# Patient Record
Sex: Male | Born: 2000 | Race: Black or African American | Hispanic: No | Marital: Single | Smoking: Former smoker
Health system: Southern US, Community
[De-identification: ages and names within clinical notes are randomized; demographics above are authoritative.]

## PROBLEM LIST (undated history)

## (undated) DIAGNOSIS — Z789 Other specified health status: Secondary | ICD-10-CM

## (undated) DIAGNOSIS — S6290XA Unspecified fracture of unspecified wrist and hand, initial encounter for closed fracture: Secondary | ICD-10-CM

## (undated) DIAGNOSIS — M9250 Juvenile osteochondrosis of tibia and fibula, unspecified leg: Secondary | ICD-10-CM

## (undated) HISTORY — DX: Unspecified fracture of unspecified wrist and hand, initial encounter for closed fracture: S62.90XA

## (undated) HISTORY — DX: Other specified health status: Z78.9

## (undated) HISTORY — DX: Juvenile osteochondrosis of tibia and fibula, unspecified leg: M92.50

---
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
## 2020-06-01 ENCOUNTER — Emergency Department (HOSPITAL_BASED_OUTPATIENT_CLINIC_OR_DEPARTMENT_OTHER)
Admission: EM | Admit: 2020-06-01 | Discharge: 2020-06-01 | Disposition: A | Discharge status: Home / Self Care | Payer: Medicaid Other | Attending: Emergency Medicine | Admitting: Emergency Medicine

## 2020-06-01 ENCOUNTER — Other Ambulatory Visit: Payer: Self-pay

## 2020-06-01 ENCOUNTER — Encounter (HOSPITAL_BASED_OUTPATIENT_CLINIC_OR_DEPARTMENT_OTHER): Payer: Self-pay | Admitting: Emergency Medicine

## 2020-06-01 ENCOUNTER — Emergency Department (HOSPITAL_BASED_OUTPATIENT_CLINIC_OR_DEPARTMENT_OTHER): Payer: Medicaid Other

## 2020-06-01 ENCOUNTER — Encounter (INDEPENDENT_AMBULATORY_CARE_PROVIDER_SITE_OTHER): Payer: Self-pay

## 2020-06-01 DIAGNOSIS — Z87891 Personal history of nicotine dependence: Secondary | ICD-10-CM | POA: Insufficient documentation

## 2020-06-01 DIAGNOSIS — B9789 Other viral agents as the cause of diseases classified elsewhere: Secondary | ICD-10-CM | POA: Diagnosis not present

## 2020-06-01 DIAGNOSIS — J069 Acute upper respiratory infection, unspecified: Secondary | ICD-10-CM | POA: Diagnosis not present

## 2020-06-01 DIAGNOSIS — R040 Epistaxis: Secondary | ICD-10-CM | POA: Diagnosis not present

## 2020-06-01 DIAGNOSIS — R1110 Vomiting, unspecified: Secondary | ICD-10-CM | POA: Diagnosis not present

## 2020-06-01 DIAGNOSIS — Z20822 Contact with and (suspected) exposure to covid-19: Secondary | ICD-10-CM | POA: Diagnosis not present

## 2020-06-01 DIAGNOSIS — R059 Cough, unspecified: Secondary | ICD-10-CM | POA: Diagnosis not present

## 2020-06-01 DIAGNOSIS — R0981 Nasal congestion: Secondary | ICD-10-CM | POA: Diagnosis present

## 2020-06-01 LAB — RESP PANEL BY RT-PCR (FLU A&B, COVID) ARPGX2
Influenza A by PCR: POSITIVE — AB
Influenza B by PCR: NEGATIVE
SARS Coronavirus 2 by RT PCR: NEGATIVE

## 2020-06-01 NOTE — ED Provider Notes (Signed)
[facility] EMERGENCY DEPARTMENT Provider Note   CSN: [id number] Arrival date & time: 06/01/20  1915     History Chief Complaint  Patient presents with  . Nasal Congestion    Ricky Peterson is a 20 y.o. male.  HPI Patient reports he started getting sick about 3 days ago.  He has had nasal congestion and coughing.  He reports he is coughed pretty hard and is coughing up some white mucus.  He reports he is also sometimes having bleeding of the nose.  Reports his only bleeding on the right side.  Is bled several times.  He has some upper body aches.  He is experiencing some pain on the left side of the chest with coughing.  Patient reports has had chills and thought he might of had a fever but has not measured it at home.  He reports one episode of vomiting at work yesterday.  No diarrhea.  No abdominal pain.  Patient has not had COVID vaccine.    Past Medical History:  Diagnosis Date  . Fracture of hand   . Medical history non-contributory   . Osgood-Schlatter's disease 10/26/2013    Patient Active Problem List   Diagnosis Date Noted  . Atopic dermatitis 12/05/2014    History reviewed. No pertinent surgical history.     No family history on file.  Social History   Tobacco Use  . Smoking status: Former Games developer  . Smokeless tobacco: Never Used  Vaping Use  . Vaping Use: Some days  Substance Use Topics  . Alcohol use: Never  . Drug use: Never    Home Medications Prior to Admission medications   Not on File    Allergies    Patient has no known allergies.  Review of Systems   Review of Systems 10 systems reviewed and negative except as per HPI Physical Exam Updated Vital Signs BP 104/64   Pulse 88   Temp 99.7 F (37.6 C) (Oral)   Resp 19   Ht 6\' 7"  (2.007 m)   Wt 90.7 kg   SpO2 99%   BMI 22.53 kg/m   Physical Exam Constitutional:      Appearance: Normal appearance. He is well-developed.     Comments: Nontoxic.  No respiratory distress.   Clinically well in appearance.  HENT:     Head: Normocephalic and atraumatic.     Nose:     Comments: The Kiesselbach plexus on the right is slightly irritated and friable.  A few punctate areas with blood on them.  No clot in the naris.  No active bleeding.  Oral cavity is widely patent.  Mucous membranes are pink and moist.  No exudate on the tonsils Eyes:     Extraocular Movements: Extraocular movements intact.     Conjunctiva/sclera: Conjunctivae normal.     Pupils: Pupils are equal, round, and reactive to light.  Cardiovascular:     Rate and Rhythm: Normal rate and regular rhythm.     Heart sounds: Normal heart sounds.  Pulmonary:     Effort: Pulmonary effort is normal.     Breath sounds: Normal breath sounds.  Abdominal:     General: Bowel sounds are normal. There is no distension.     Palpations: Abdomen is soft.     Tenderness: There is no abdominal tenderness.  Musculoskeletal:        General: No swelling or tenderness. Normal range of motion.     Cervical back: Neck supple.     Right  lower leg: No edema.     Left lower leg: No edema.  Skin:    General: Skin is warm and dry.  Neurological:     General: No focal deficit present.     Mental Status: He is alert and oriented to person, place, and time.     GCS: GCS eye subscore is 4. GCS verbal subscore is 5. GCS motor subscore is 6.     Coordination: Coordination normal.  Psychiatric:        Mood and Affect: Mood normal.     ED Results / Procedures / Treatments   Labs (all labs ordered are listed, but only abnormal results are displayed) Labs Reviewed  RESP PANEL BY RT-PCR (FLU A&B, COVID) [id number]    EKG None  Radiology DG Chest 2 View  Result Date: 06/01/2020 CLINICAL DATA:  Cough EXAM: CHEST - 2 VIEW COMPARISON:  08/01/2009 FINDINGS: The heart size and mediastinal contours are within normal limits. Both lungs are clear. The visualized skeletal structures are unremarkable. IMPRESSION: Normal study. Electronically  Signed   By: Charlett Nose M.D.   On: 06/01/2020 20:52    Procedures Procedures   Medications Ordered in ED Medications - No data to display  ED Course  I have reviewed the triage vital signs and the nursing notes.  Pertinent labs & imaging results that were available during my care of the patient were reviewed by me and considered in my medical decision making (see chart for details).    MDM Rules/Calculators/A&P                          Chest x-ray is clear.  No signs of pneumonia.  Patient is clinically well without hypoxia.  No significant risk factors for complicated respiratory illness.  Covid testing still pending however patient does not have any hypoxia, respiratory distress or other symptoms that would necessitate intervention other than supportive care.  Patient has some superficial bleeding at the [facility] plexus.  No active bleeding at this time.  He is counseled on controlling nosebleeds at home.  Patient instructed to follow-up with the PCP.  Return precautions reviewed.  Ricky Peterson was evaluated in Emergency Department on 06/01/2020 for the symptoms described in the history of present illness. He was evaluated in the context of the global COVID-19 pandemic, which necessitated consideration that the patient might be at risk for infection with the SARS-CoV-2 virus that causes COVID-19. Institutional protocols and algorithms that pertain to the evaluation of patients at risk for COVID-19 are in a state of rapid change based on information released by regulatory bodies including the CDC and federal and state organizations. These policies and algorithms were followed during the patient's care in the ED. Final Clinical Impression(s) / ED Diagnoses Final diagnoses:  Viral upper respiratory tract infection  Right-sided nosebleed    Rx / DC Orders ED Discharge Orders    None       Arby Barrette, MD 06/01/20 2219

## 2020-06-01 NOTE — Discharge Instructions (Signed)
1.  At this time you appear to have a viral respiratory illness.  Your chest x-ray does not show any evidence of pneumonia. 2.  Your Covid test is still being done.  Follow instructions for awaiting your Covid test.  You should be able to see the results in your "MyChart" within the next few hours. 3.  You are bleeding from the small vessels in the front of your nose on the area called the septum.  This is a common place for nosebleeds.  It can occur due to minor trauma, colds and congestion and dryness.  If your bleeding pinch your nose as instructed for 15 minutes.  You may also apply a thin layer of Vaseline to the area.  Do not wipe or picking your nose. 4.  See your family doctor for recheck in the next 3 to 7 days.  If you do not have a family doctor, use the referral number in your discharge instructions to find 1.  If your symptoms are getting worse and you are having increasing pain, difficulty breathing or other concerning symptoms, return to the emergency department.

## 2020-06-01 NOTE — ED Triage Notes (Signed)
Pt reports nasal congestion for the past two days and intermittent nose bleeds that last for approx 15 min; pt reports chills, productive cough and headache

## 2020-07-31 ENCOUNTER — Emergency Department (HOSPITAL_BASED_OUTPATIENT_CLINIC_OR_DEPARTMENT_OTHER)
Admission: EM | Admit: 2020-07-31 | Discharge: 2020-07-31 | Disposition: A | Discharge status: Home / Self Care | Payer: Medicaid Other | Attending: Emergency Medicine | Admitting: Emergency Medicine

## 2020-07-31 ENCOUNTER — Emergency Department (HOSPITAL_BASED_OUTPATIENT_CLINIC_OR_DEPARTMENT_OTHER): Payer: Medicaid Other

## 2020-07-31 ENCOUNTER — Encounter (HOSPITAL_BASED_OUTPATIENT_CLINIC_OR_DEPARTMENT_OTHER): Payer: Self-pay | Admitting: *Deleted

## 2020-07-31 ENCOUNTER — Other Ambulatory Visit: Payer: Self-pay

## 2020-07-31 DIAGNOSIS — Z87891 Personal history of nicotine dependence: Secondary | ICD-10-CM | POA: Insufficient documentation

## 2020-07-31 DIAGNOSIS — M5459 Other low back pain: Secondary | ICD-10-CM | POA: Diagnosis not present

## 2020-07-31 DIAGNOSIS — M545 Low back pain, unspecified: Secondary | ICD-10-CM | POA: Insufficient documentation

## 2020-07-31 MED ORDER — CYCLOBENZAPRINE HCL 10 MG PO TABS: 10.0000 mg | ORAL_TABLET | Freq: Two times a day (BID) | ORAL | 0 refills | Status: AC | PRN

## 2020-07-31 MED ORDER — IBUPROFEN 600 MG PO TABS: 600.0000 mg | ORAL_TABLET | Freq: Four times a day (QID) | ORAL | 0 refills | Status: AC | PRN

## 2020-07-31 NOTE — ED Provider Notes (Signed)
[facility] EMERGENCY DEPARTMENT Provider Note   CSN: [id number] Arrival date & time: 07/31/20  1924     History Chief Complaint  Patient presents with  . Back Pain    Ricky Peterson is a 20 y.o. male significant past medical history, presenting for evaluation of 3 days of right-sided lower back pain.  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  He does not recall particular injury though woke up with pain in his back that he describes as a "pinching" pain.  Pain is worse with particular movements and is more so on the right lower back.  Has treated with Tylenol.  He denies any numbness or weakness in the extremities, bowel or bladder incontinence, saddle paresthesia.  No radiation into his legs.  No prior back injury.  The history is provided by the patient.       Past Medical History:  Diagnosis Date  . Fracture of hand   . Medical history non-contributory   . Osgood-Schlatter's disease 10/26/2013    Patient Active Problem List   Diagnosis Date Noted  . Atopic dermatitis 12/05/2014    History reviewed. No pertinent surgical history.     No family history on file.  Social History   Tobacco Use  . Smoking status: Former Games developer  . Smokeless tobacco: Never Used  Vaping Use  . Vaping Use: Some days  Substance Use Topics  . Alcohol use: Never  . Drug use: Never    Home Medications Prior to Admission medications   Medication Sig Start Date End Date Taking? Authorizing Provider  cyclobenzaprine (FLEXERIL) 10 MG tablet Take 1 tablet (10 mg total) by mouth 2 (two) times daily as needed for muscle spasms. 07/31/20  Yes Crescent Gotham, [place] N, PA-C  ibuprofen (ADVIL) 600 MG tablet Take 1 tablet (600 mg total) by mouth every 6 (six) hours as needed for moderate pain. 07/31/20  Yes Laveah Gloster, [place] N, PA-C    Allergies    Patient has no known allergies.  Review of Systems   Review of Systems  Musculoskeletal: Positive for back pain.   Neurological: Negative for weakness and numbness.    Physical Exam Updated Vital Signs BP (!) 135/59 (BP Location: Right Arm)   Pulse 83   Temp 97.9 F (36.6 C) (Oral)   Resp 18   Ht 6\' 7"  (2.007 m)   Wt 90.7 kg   SpO2 100%   BMI 22.53 kg/m   Physical Exam Vitals and nursing note reviewed.  Constitutional:      Appearance: He is well-developed.  HENT:     Head: Normocephalic and atraumatic.  Eyes:     Conjunctiva/sclera: Conjunctivae normal.  Cardiovascular:     Rate and Rhythm: Normal rate and regular rhythm.  Pulmonary:     Effort: Pulmonary effort is normal.     Breath sounds: Normal breath sounds.  Abdominal:     General: Bowel sounds are normal.     Palpations: Abdomen is soft.     Tenderness: There is no abdominal tenderness.  Musculoskeletal:     Comments: No localized midline L-spine tenderness, no bony step-offs or gross deformities.  There is palpable spasm to right lumbar paraspinal musculature.  There is associated tenderness to the musculature.  Normal range of motion.  Gait changes.  Neurological:     Mental Status: He is alert.     Comments: Normal tone.  5/5 strength in BLE including strong and equal dorsiflexion/plantar flexion Sensory:  light touch normal in BLE extremities.   Gait: normal gait and balance CV: distal pulses palpable throughout    Psychiatric:        Mood and Affect: Mood normal.        Behavior: Behavior normal.     ED Results / Procedures / Treatments   Labs (all labs ordered are listed, but only abnormal results are displayed) Labs Reviewed - No data to display  EKG None  Radiology DG Lumbar Spine Complete  Result Date: 07/31/2020 CLINICAL DATA:  Mid lower back pain EXAM: LUMBAR SPINE - COMPLETE 4+ VIEW COMPARISON:  None. FINDINGS: There is no evidence of lumbar spine fracture. Alignment is normal. Intervertebral disc spaces are maintained. IMPRESSION: Negative. Electronically Signed   By: Jasmine Pang M.D.   On:  07/31/2020 21:08    Procedures Procedures   Medications Ordered in ED Medications - No data to display  ED Course  I have reviewed the triage vital signs and the nursing notes.  Pertinent labs & imaging results that were available during my care of the patient were reviewed by me and considered in my medical decision making (see chart for details).    MDM Rules/Calculators/A&P                          Patient with low back pain and palpable muscle spasm to the right lumbar paraspinal musculature.  Plain film of the L-spine shows preserved intervertebral disc spaces with normal alignment and no evidence of fracture.  No neurological deficits and normal neuro exam.  Patient can play without difficulty in the ED.  No loss of bowel or bladder control.  No concern for cauda equina.  RICE protocol and pain medicine indicated and discussed with patient.   Discussed results, findings, treatment and follow up. Patient advised of return precautions. Patient verbalized understanding and agreed with plan.   Final Clinical Impression(s) / ED Diagnoses Final diagnoses:  Acute right-sided low back pain without sciatica    Rx / DC Orders ED Discharge Orders         Ordered    cyclobenzaprine (FLEXERIL) 10 MG tablet  2 times daily PRN        07/31/20 2134    ibuprofen (ADVIL) 600 MG tablet  Every 6 hours PRN        07/31/20 2134           Munachimso Rigdon, [place] N, PA-C 07/31/20 2134    Tegeler, Canary Brim, MD 07/31/20 2325

## 2020-07-31 NOTE — ED Triage Notes (Signed)
C/o mid/lower back pain x 3 days, denies injury

## 2020-07-31 NOTE — Discharge Instructions (Addendum)
Please read instructions below.  You can take 600 mg of ibuprofen every 6 hours as needed for pain.   Apply ice to your back for 20 minutes at a time.  You can also apply heat if this provides more relief.   You can take Flexeril/cyclobenzaprine every 12 hours as needed for muscle spasm.  Be aware this medication can make you drowsy; do not take while driving or drinking alcohol.   Avoid heavy lifting for the next few days to allow your back to heal. Follow-up with your primary care provider symptoms persist.   Return to ER if new numbness or tingling in your arms or legs, inability to urinate, inability to hold your bowels, or weakness in your extremities.

## 2020-08-01 ENCOUNTER — Telehealth: Payer: Self-pay | Admitting: *Deleted

## 2020-08-01 NOTE — Telephone Encounter (Signed)
Transition Care Management Follow-up Telephone Call  Date of discharge and from where: 07/31/2020 - [facility]  How have you been since you were released from the hospital? "My back is still hurting"  Any questions or concerns? No  Items Reviewed:  Did the pt receive and understand the discharge instructions provided? Yes   Medications obtained and verified? Yes   Other? No   Any new allergies since your discharge? No   Dietary orders reviewed? No  Do you have support at home? Yes    Functional Questionnaire: (I = Independent and D = Dependent) ADLs: I  Bathing/Dressing- I  Meal Prep- I  Eating- I  Maintaining continence- I  Transferring/Ambulation- I  Managing Meds- I  Follow up appointments reviewed:   PCP Hospital f/u appt confirmed? No    Specialist Hospital f/u appt confirmed? No    Are transportation arrangements needed? No   If their condition worsens, is the pt aware to call PCP or go to the Emergency Dept.? Yes  Was the patient provided with contact information for the PCP's office or ED? Yes  Was to pt encouraged to call back with questions or concerns? Yes

## 2020-08-28 ENCOUNTER — Encounter (HOSPITAL_COMMUNITY): Payer: Self-pay

## 2020-08-28 ENCOUNTER — Other Ambulatory Visit: Payer: Self-pay

## 2020-08-28 ENCOUNTER — Emergency Department (HOSPITAL_COMMUNITY)
Admission: EM | Admit: 2020-08-28 | Discharge: 2020-08-28 | Disposition: A | Discharge status: Home / Self Care | Payer: Medicaid Other | Attending: Emergency Medicine | Admitting: Emergency Medicine

## 2020-08-28 DIAGNOSIS — M546 Pain in thoracic spine: Secondary | ICD-10-CM | POA: Diagnosis not present

## 2020-08-28 DIAGNOSIS — Y92410 Unspecified street and highway as the place of occurrence of the external cause: Secondary | ICD-10-CM | POA: Insufficient documentation

## 2020-08-28 DIAGNOSIS — Z87891 Personal history of nicotine dependence: Secondary | ICD-10-CM | POA: Insufficient documentation

## 2020-08-28 DIAGNOSIS — S00531A Contusion of lip, initial encounter: Secondary | ICD-10-CM | POA: Insufficient documentation

## 2020-08-28 DIAGNOSIS — M542 Cervicalgia: Secondary | ICD-10-CM | POA: Diagnosis not present

## 2020-08-28 DIAGNOSIS — R0789 Other chest pain: Secondary | ICD-10-CM | POA: Diagnosis not present

## 2020-08-28 DIAGNOSIS — S0993XA Unspecified injury of face, initial encounter: Secondary | ICD-10-CM | POA: Diagnosis present

## 2020-08-28 MED ORDER — IBUPROFEN 200 MG PO TABS
600.0000 mg | ORAL_TABLET | Freq: Once | ORAL | Status: AC
  Administered 2020-08-28: 600 mg via ORAL
  Filled 2020-08-28: qty 3
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
## 2020-08-28 NOTE — ED Notes (Signed)
Unable to obtain signature due to Top[employer] not working

## 2020-08-28 NOTE — Discharge Instructions (Addendum)
Return for any problem.   Take Ibuprofen - 600 mg by mouth - every 8 hours for pain. Take with food.

## 2020-08-29 ENCOUNTER — Telehealth: Payer: Self-pay | Admitting: *Deleted

## 2020-08-29 NOTE — Telephone Encounter (Signed)
Transition Care Management Follow-up Telephone Call Date of discharge and from where: 08/28/2020 - [facility] How have you been since you were released from the hospital? "Fine" Any questions or concerns? No  Items Reviewed: Did the pt receive and understand the discharge instructions provided? Yes  Medications obtained and verified? No  Other? No  Any new allergies since your discharge? No  Dietary orders reviewed? No Do you have support at home? Yes    Functional Questionnaire: (I = Independent and D = Dependent) ADLs: I  Bathing/Dressing- I  Meal Prep- I  Eating- I  Maintaining continence- I  Transferring/Ambulation- I  Managing Meds- I  Follow up appointments reviewed:  PCP Hospital f/u appt confirmed? No   Specialist Hospital f/u appt confirmed? No   Are transportation arrangements needed?  N/A If their condition worsens, is the pt aware to call PCP or go to the Emergency Dept.? Yes Was the patient provided with contact information for the PCP's office or ED? Yes Was to pt encouraged to call back with questions or concerns? Yes

## 2020-09-30 ENCOUNTER — Encounter (HOSPITAL_COMMUNITY): Payer: Self-pay

## 2020-09-30 ENCOUNTER — Emergency Department (HOSPITAL_COMMUNITY)
Admission: EM | Admit: 2020-09-30 | Discharge: 2020-09-30 | Disposition: A | Discharge status: Home / Self Care | Payer: Medicaid Other | Attending: Emergency Medicine | Admitting: Emergency Medicine

## 2020-09-30 ENCOUNTER — Other Ambulatory Visit: Payer: Self-pay

## 2020-09-30 DIAGNOSIS — U071 COVID-19: Secondary | ICD-10-CM | POA: Insufficient documentation

## 2020-09-30 DIAGNOSIS — Z28310 Unvaccinated for covid-19: Secondary | ICD-10-CM | POA: Diagnosis not present

## 2020-09-30 DIAGNOSIS — Z87891 Personal history of nicotine dependence: Secondary | ICD-10-CM | POA: Insufficient documentation

## 2020-09-30 DIAGNOSIS — J069 Acute upper respiratory infection, unspecified: Secondary | ICD-10-CM | POA: Diagnosis not present

## 2020-09-30 DIAGNOSIS — B9789 Other viral agents as the cause of diseases classified elsewhere: Secondary | ICD-10-CM | POA: Diagnosis not present

## 2020-09-30 DIAGNOSIS — R0981 Nasal congestion: Secondary | ICD-10-CM | POA: Diagnosis present

## 2020-09-30 LAB — RESP PANEL BY RT-PCR (FLU A&B, COVID) ARPGX2
Influenza A by PCR: NEGATIVE
Influenza B by PCR: NEGATIVE
SARS Coronavirus 2 by RT PCR: POSITIVE — AB

## 2020-09-30 NOTE — Discharge Instructions (Addendum)
Please check the results of your COVID-19 test on MyChart.  If it is positive you will need to quarantine based on the current CDC guidelines.  I would recommend a combination of Flonase as well as Zyrtec.  You can buy both of these over-the-counter at your local pharmacy.  You can find the generic brands next to these medications.  They should be much cheaper.  Please continue to monitor your symptoms closely.  If you develop any new or worsening symptoms please come back to the emergency department.  It was a pleasure to meet you.

## 2020-09-30 NOTE — ED Provider Notes (Signed)
[facility]-EMERGENCY DEPT Provider Note   CSN: [id number] Arrival date & time: 09/30/20  1656     History Chief Complaint  Patient presents with   Nasal Congestion    Ricky Peterson is a 20 y.o. male.  HPI Patient is a 20 year old male who presents to the emergency department due to rhinorrhea, sinus pressure, ear fullness, sore throat, that started 5 days ago.  Patient states his symptoms have began improving.  No chest pain, shortness of breath, nausea, vomiting, diarrhea.  He is not vaccinated for COVID-19 and denies any known history of previous COVID-19 infection.    Past Medical History:  Diagnosis Date   Fracture of hand    Medical history non-contributory    Osgood-Schlatter's disease 10/26/2013    Patient Active Problem List   Diagnosis Date Noted   Atopic dermatitis 12/05/2014    History reviewed. No pertinent surgical history.     Family History  Problem Relation Age of Onset   Healthy Mother    Diabetes Father     Social History   Tobacco Use   Smoking status: Former   Smokeless tobacco: Never  Building services engineer Use: Some days   Substances: Flavoring  Substance Use Topics   Alcohol use: Never   Drug use: Never    Home Medications Prior to Admission medications   Medication Sig Start Date End Date Taking? Authorizing Provider  cyclobenzaprine (FLEXERIL) 10 MG tablet Take 1 tablet (10 mg total) by mouth 2 (two) times daily as needed for muscle spasms. 07/31/20   Robinson, [place] N, PA-C  ibuprofen (ADVIL) 600 MG tablet Take 1 tablet (600 mg total) by mouth every 6 (six) hours as needed for moderate pain. 07/31/20   Robinson, [place] N, PA-C    Allergies    Patient has no known allergies.  Review of Systems   Review of Systems  Constitutional:  Positive for fatigue. Negative for chills and fever.  HENT:  Positive for congestion, ear pain, postnasal drip, rhinorrhea, sinus pressure and sore throat. Negative for ear discharge.    Respiratory:  Negative for cough and shortness of breath.   Cardiovascular:  Negative for chest pain.  Gastrointestinal:  Negative for diarrhea, nausea and vomiting.   Physical Exam Updated Vital Signs BP 126/80 (BP Location: Right Arm)   Pulse 68   Temp 98.4 F (36.9 C) (Oral)   Resp 16   Ht 6\' 7"  (2.007 m)   Wt 90.7 kg   SpO2 100%   BMI 22.53 kg/m   Physical Exam Vitals and nursing note reviewed.  Constitutional:      General: He is not in acute distress.    Appearance: Normal appearance. He is not ill-appearing, toxic-appearing or diaphoretic.  HENT:     Head: Normocephalic and atraumatic.     Right Ear: Tympanic membrane, ear canal and external ear normal. There is no impacted cerumen.     Left Ear: Tympanic membrane, ear canal and external ear normal. There is no impacted cerumen.     Ears:     Comments: Bilateral ears, EACs, and TMs appear normal.    Nose: Congestion and rhinorrhea present.     Comments: Boggy erythematous nasal turbinates.  Congestion and rhinorrhea noted.    Mouth/Throat:     Mouth: Mucous membranes are moist.     Pharynx: Oropharynx is clear. No oropharyngeal exudate or posterior oropharyngeal erythema.     Comments: Uvula midline.  No erythema noted in the posterior  oropharynx.  No tonsillar hypertrophy.  No exudates.  Readily handling secretions.  No hot potato voice. Eyes:     Extraocular Movements: Extraocular movements intact.  Cardiovascular:     Rate and Rhythm: Normal rate.  Pulmonary:     Effort: Pulmonary effort is normal.  Abdominal:     General: Abdomen is flat. There is no distension.  Musculoskeletal:        General: Normal range of motion.     Cervical back: Normal range of motion and neck supple. No tenderness.  Skin:    General: Skin is warm and dry.  Neurological:     General: No focal deficit present.     Mental Status: He is alert and oriented to person, place, and time.  Psychiatric:        Mood and Affect: Mood  normal.        Behavior: Behavior normal.    ED Results / Procedures / Treatments   Labs (all labs ordered are listed, but only abnormal results are displayed) Labs Reviewed  RESP PANEL BY RT-PCR (FLU A&B, COVID) [id number]    EKG None  Radiology No results found.  Procedures Procedures   Medications Ordered in ED Medications - No data to display  ED Course  I have reviewed the triage vital signs and the nursing notes.  Pertinent labs & imaging results that were available during my care of the patient were reviewed by me and considered in my medical decision making (see chart for details).    MDM Rules/Calculators/A&P                          Patient is a 20 year old male who presents to the emergency department due to what appears to be a viral URI.  Physical exam significant for congestion as well as rhinorrhea.  Ears and posterior oropharynx appear normal.  Patient denies any chest pain, shortness of breath, nausea, vomiting, diarrhea.  Patient denies a known history of previous COVID-19 infection and has not been vaccinated for COVID-19.  We will obtain a COVID-19 test at this visit.  He is going to check these results on MyChart.  Recommended that he quarantine based on CDC guidelines if he finds that he is positive.  Discussed OTC medications for management of his symptoms.  Feel that he is stable for discharge at this time and he is agreeable.  Given strict return precautions.  His questions were answered and he was amicable at the time of discharge.  Final Clinical Impression(s) / ED Diagnoses Final diagnoses:  Viral URI    Rx / DC Orders ED Discharge Orders     None        Placido Sou, PA-C 09/30/20 1931    Derwood Kaplan, MD 10/01/20 [phone]

## 2020-09-30 NOTE — ED Triage Notes (Signed)
Patient c/o nasal congestion x 6 days. Patient also reports that he feels like his "ears are clogged."

## 2020-10-01 ENCOUNTER — Telehealth: Payer: Self-pay

## 2020-10-01 DIAGNOSIS — Z9189 Other specified personal risk factors, not elsewhere classified: Secondary | ICD-10-CM

## 2020-10-01 NOTE — Telephone Encounter (Signed)
Transition Care Management Follow-up Telephone Call Date of discharge and from where: 09/30/2020-[facility]  How have you been since you were released from the hospital? Patient stated he is doing fine.  Any questions or concerns? No  Items Reviewed: Did the pt receive and understand the discharge instructions provided? Yes  Medications obtained and verified?  Patient advised to start over the counter meds. Other? No  Any new allergies since your discharge? No  Dietary orders reviewed? N/A Do you have support at home? Yes   Home Care and Equipment/Supplies: Were home health services ordered? not applicable If so, what is the name of the agency? N/A  Has the agency set up a time to come to the patient's home? not applicable Were any new equipment or medical supplies ordered?  No What is the name of the medical supply agency? N/A Were you able to get the supplies/equipment? not applicable Do you have any questions related to the use of the equipment or supplies? No  Functional Questionnaire: (I = Independent and D = Dependent) ADLs: I  Bathing/Dressing- I  Meal Prep- I  Eating- I  Maintaining continence- I  Transferring/Ambulation- I  Managing Meds- I  Follow up appointments reviewed:  PCP Hospital f/u appt confirmed? No  placing referral for assistance with finding a PCP.  Specialist Hospital f/u appt confirmed? No   Are transportation arrangements needed? No  If their condition worsens, is the pt aware to call PCP or go to the Emergency Dept.? Yes Was the patient provided with contact information for the PCP's office or ED? Yes Was to pt encouraged to call back with questions or concerns? Yes

## 2020-10-06 ENCOUNTER — Other Ambulatory Visit: Payer: Self-pay

## 2020-10-06 ENCOUNTER — Emergency Department (HOSPITAL_COMMUNITY)
Admission: EM | Admit: 2020-10-06 | Discharge: 2020-10-06 | Disposition: A | Discharge status: Home / Self Care | Payer: Medicaid Other | Attending: Emergency Medicine | Admitting: Emergency Medicine

## 2020-10-06 ENCOUNTER — Encounter (HOSPITAL_COMMUNITY): Payer: Self-pay | Admitting: *Deleted

## 2020-10-06 DIAGNOSIS — R059 Cough, unspecified: Secondary | ICD-10-CM | POA: Diagnosis present

## 2020-10-06 DIAGNOSIS — Z87891 Personal history of nicotine dependence: Secondary | ICD-10-CM | POA: Insufficient documentation

## 2020-10-06 DIAGNOSIS — Z20822 Contact with and (suspected) exposure to covid-19: Secondary | ICD-10-CM | POA: Diagnosis not present

## 2020-10-06 DIAGNOSIS — U071 COVID-19: Secondary | ICD-10-CM | POA: Insufficient documentation

## 2020-10-06 NOTE — ED Provider Notes (Signed)
[facility]-EMERGENCY DEPT Provider Note   CSN: [id number] Arrival date & time: 10/06/20  1616     History Chief Complaint  Patient presents with   wants COVID test    Ricky Peterson is a 20 y.o. male.  20 y.o male with no PMH presents to the ED requesting a COVID-19 test.  Patient tested positive on August second 2022 via PCR in our emergency department.  He has been out of work since this test.  He is requesting a test in order to return back to work.  We discussed PCR test will likely be positive on today's visit, unable to provide him with an antigen test.  He currently continues to have sniffles, cough, has been keeping his symptoms with over-the-counter medication.  No chest pain, no shortness of breath.  The history is provided by the patient and medical records.      Past Medical History:  Diagnosis Date   Fracture of hand    Medical history non-contributory    Osgood-Schlatter's disease 10/26/2013    Patient Active Problem List   Diagnosis Date Noted   Atopic dermatitis 12/05/2014    History reviewed. No pertinent surgical history.     Family History  Problem Relation Age of Onset   Healthy Mother    Diabetes Father     Social History   Tobacco Use   Smoking status: Former   Smokeless tobacco: Never  Building services engineer Use: Some days   Substances: Flavoring  Substance Use Topics   Alcohol use: Never   Drug use: Never    Home Medications Prior to Admission medications   Medication Sig Start Date End Date Taking? Authorizing Provider  cyclobenzaprine (FLEXERIL) 10 MG tablet Take 1 tablet (10 mg total) by mouth 2 (two) times daily as needed for muscle spasms. 07/31/20   Robinson, [place] N, PA-C  ibuprofen (ADVIL) 600 MG tablet Take 1 tablet (600 mg total) by mouth every 6 (six) hours as needed for moderate pain. 07/31/20   Robinson, [place] N, PA-C    Allergies    Patient has no known allergies.  Review of Systems   Review of Systems   Constitutional:  Negative for chills and fever.  HENT:  Positive for ear discharge and postnasal drip.   Respiratory:  Negative for chest tightness.   Cardiovascular:  Negative for chest pain.  Gastrointestinal:  Negative for abdominal pain.   Physical Exam Updated Vital Signs BP 116/78 (BP Location: Right Arm)   Pulse 84   Temp 97.8 F (36.6 C) (Oral)   Resp 17   SpO2 98%   Physical Exam Vitals and nursing note reviewed.  Constitutional:      Appearance: Normal appearance.  HENT:     Head: Normocephalic and atraumatic.  Eyes:     Pupils: Pupils are equal, round, and reactive to light.  Cardiovascular:     Rate and Rhythm: Normal rate.  Pulmonary:     Effort: Pulmonary effort is normal.     Breath sounds: No wheezing.  Abdominal:     General: Abdomen is flat.  Musculoskeletal:     Cervical back: Normal range of motion and neck supple.  Skin:    General: Skin is warm and dry.  Neurological:     Mental Status: He is alert and oriented to person, place, and time.    ED Results / Procedures / Treatments   Labs (all labs ordered are listed, but only abnormal results are displayed)  Labs Reviewed - No data to display  EKG None  Radiology No results found.  Procedures Procedures   Medications Ordered in ED Medications - No data to display  ED Course  I have reviewed the triage vital signs and the nursing notes.  Pertinent labs & imaging results that were available during my care of the patient were reviewed by me and considered in my medical decision making (see chart for details).    MDM Rules/Calculators/A&P   Patient presents to the ED requesting a COVID-19 test in order to return back to work.  His prior COVID-19 test was positive on September 30, 2020, we discussed PCR repetition would only test positive at this time.  He is requesting a note in order to return back to work.  He endorses some nasal congestion, feel like he is somewhat fatigued but would like  to return back to work at this time.  His vitals are within normal limits, he has been afebrile for the last couple days.  Given a note in order to return back to work, we also discussed antigen testing as needed for work proof.  Patient does agree to measure, return precaution discussed at length.  Patient stable for discharge.   Portions of this note were generated with Scientist, clinical (histocompatibility and immunogenetics). Dictation errors may occur despite best attempts at proofreading.  Final Clinical Impression(s) / ED Diagnoses Final diagnoses:  Encounter for laboratory testing for COVID-19 virus    Rx / DC Orders ED Discharge Orders     None        Claude Manges, PA-C 10/06/20 1821    Mancel Bale, MD 10/07/20 1016

## 2020-10-06 NOTE — ED Triage Notes (Signed)
Pt tested positive for COVID last week and would like another COVID test today

## 2020-10-06 NOTE — Discharge Instructions (Addendum)
Your COVID-19 test was positive on 10/01/2018 you will continue to test positive for likely for the next couple of weeks.  The CDC now recommends you stay out of work 5 days after your onset of symptoms, and return to work the following 5 days while wearing a mask.  If you need a negative test you will need to obtain an antigen test by another facility.

## 2020-10-07 ENCOUNTER — Telehealth: Payer: Self-pay

## 2020-10-07 NOTE — Telephone Encounter (Signed)
Transition Care Management Unsuccessful Follow-up Telephone Call  Date of discharge and from where:  10/06/2020-[place] [facility]  Attempts:  1st Attempt  Reason for unsuccessful TCM follow-up call:  Left voice message    

## 2020-10-08 NOTE — Telephone Encounter (Signed)
Transition Care Management Unsuccessful Follow-up Telephone Call  Date of discharge and from where:  10/06/2020 from [place]  Attempts:  2nd Attempt  Reason for unsuccessful TCM follow-up call:  Left voice message    

## 2020-10-09 ENCOUNTER — Telehealth: Payer: Self-pay

## 2020-10-09 NOTE — Telephone Encounter (Signed)
   Telephone encounter was:  Successful.  10/09/2020 Name: Ricky Peterson MRN: [id number] DOB: 2000/08/05  [name] is a 19 y.o. year old male who is a primary care patient of Patient, No Pcp Per (Inactive) . The community resource team was consulted for assistance with  finding a PCP.  Care guide performed the following interventions: Patient provided with information about care guide support team and interviewed to confirm resource needs Discussed resources to assist with finding his previous PCP. Patient asked who his last PCP was, through the notes, I was able to find Maia Breslow, MD and provided her number. He will call to see if she can see him as an adult and if not, he will find another PCP .  Follow Up Plan:  No further follow up planned at this time. The patient has been provided with needed resources.  April Green Care Guide, Embedded Care Coordination [facility], Care Management Phone: [phone] Email: [email or web address]

## 2020-10-09 NOTE — Telephone Encounter (Signed)
Transition Care Management Unsuccessful Follow-up Telephone Call  Date of discharge and from where:  10/06/2020-[place]   Attempts:  3rd Attempt  Reason for unsuccessful TCM follow-up call:  Left voice message

## 2020-11-17 ENCOUNTER — Other Ambulatory Visit: Payer: Self-pay

## 2020-11-17 ENCOUNTER — Encounter (HOSPITAL_COMMUNITY): Payer: Self-pay

## 2020-11-17 ENCOUNTER — Emergency Department (HOSPITAL_COMMUNITY): Payer: Medicaid Other

## 2020-11-17 ENCOUNTER — Emergency Department (HOSPITAL_COMMUNITY)
Admission: EM | Admit: 2020-11-17 | Discharge: 2020-11-17 | Disposition: A | Discharge status: Home / Self Care | Payer: Medicaid Other | Attending: Emergency Medicine | Admitting: Emergency Medicine

## 2020-11-17 DIAGNOSIS — M25562 Pain in left knee: Secondary | ICD-10-CM | POA: Diagnosis not present

## 2020-11-17 DIAGNOSIS — Y9367 Activity, basketball: Secondary | ICD-10-CM | POA: Diagnosis not present

## 2020-11-17 DIAGNOSIS — Z87891 Personal history of nicotine dependence: Secondary | ICD-10-CM | POA: Diagnosis not present

## 2020-11-17 DIAGNOSIS — R2242 Localized swelling, mass and lump, left lower limb: Secondary | ICD-10-CM | POA: Insufficient documentation

## 2020-11-17 NOTE — ED Provider Notes (Signed)
[facility]-EMERGENC[facility] DEPT Provider Note   CSN: [id number] Arrival date & time: 11/17/20  1601     History Chief Complaint  Patient presents with   Knee Pain    Ricky Peterson is a 20 y.o. male reports left knee pain, swelling secondary to an injury secondary to playing basketball two weeks ago.  Patient reports that he came down hard on the left knee while playing basketball, was able to walk away from the injury, however reported that he had some significant swelling, pain right after the injury.  Patient reports that he has been able to walk since the injury, with some pain and swelling.  Patient reports that he has had to miss work due to the pain and swelling.  Most of the time, however patient is able to walk without difficulty.  Patient denies any numbness or tingling of the affected limb.   Knee Pain     Past Medical History:  Diagnosis Date   Fracture of hand    Medical history non-contributory    Osgood-Schlatter's disease 10/26/2013    Patient Active Problem List   Diagnosis Date Noted   Atopic dermatitis 12/05/2014    History reviewed. No pertinent surgical history.     Family History  Problem Relation Age of Onset   Healthy Mother    Diabetes Father     Social History   Tobacco Use   Smoking status: Former   Smokeless tobacco: Never  Building services engineer Use: Some days   Substances: Flavoring  Substance Use Topics   Alcohol use: Never   Drug use: Never    Home Medications Prior to Admission medications   Medication Sig Start Date End Date Taking? Authorizing Provider  cyclobenzaprine (FLEXERIL) 10 MG tablet Take 1 tablet (10 mg total) by mouth 2 (two) times daily as needed for muscle spasms. 07/31/20   Robinson, [place] N, PA-C  ibuprofen (ADVIL) 600 MG tablet Take 1 tablet (600 mg total) by mouth every 6 (six) hours as needed for moderate pain. 07/31/20   Robinson, [place] N, PA-C    Allergies    Patient has no known  allergies.  Review of Systems   Review of Systems  Musculoskeletal:  Positive for joint swelling.  All other systems reviewed and are negative.  Physical Exam Updated Vital Signs BP 133/82   Pulse (!) 55   Temp 98 F (36.7 C) (Oral)   Resp 18   Ht 6\' 6"  (1.981 m)   Wt 92.5 kg   SpO2 (!) 10%   BMI 23.57 kg/m   Physical Exam Vitals and nursing note reviewed.  Constitutional:      General: He is not in acute distress.    Appearance: Normal appearance.  HENT:     Head: Normocephalic and atraumatic.  Eyes:     General:        Right eye: No discharge.        Left eye: No discharge.  Cardiovascular:     Rate and Rhythm: Normal rate and regular rhythm.     Comments: DP, PT pulses intact in left leg Pulmonary:     Effort: Pulmonary effort is normal. No respiratory distress.  Musculoskeletal:        General: No deformity.     Comments: No laxity to anterior, posterior drawer, no laxity varus or valgus stress.  Minimal clicking, discomfort with McMurray test.  Negative balloon, ballottement.  No redness, erythema.  Minimal tenderness to palpation of the  lateral patella, no tenderness to palpation of the medial patella.  Bony prominence of proximal tibia, consistent with prior diagnosis of Osgood-Schlatter  Skin:    General: Skin is warm and dry.     Capillary Refill: Capillary refill takes less than 2 seconds. distal to injury Neurological:     Mental Status: He is alert and oriented to person, place, and time.  Psychiatric:        Mood and Affect: Mood normal.        Behavior: Behavior normal.    ED Results / Procedures / Treatments   Labs (all labs ordered are listed, but only abnormal results are displayed) Labs Reviewed - No data to display  EKG None  Radiology DG Knee Complete 4 Views Left  Result Date: 11/17/2020 CLINICAL DATA:  Left knee pain and swelling for several weeks following basketball injury, initial encounter EXAM: LEFT KNEE - COMPLETE 4+ VIEW  COMPARISON:  None. FINDINGS: No evidence of fracture, dislocation, or joint effusion. No evidence of arthropathy or other focal bone abnormality. Soft tissues are unremarkable. IMPRESSION: No acute abnormality noted. Electronically Signed   By: Alcide Clever M.D.   On: 11/17/2020 17:16    Procedures Procedures   Medications Ordered in ED Medications - No data to display  ED Course  I have reviewed the triage vital signs and the nursing notes.  Pertinent labs & imaging results that were available during my care of the patient were reviewed by me and considered in my medical decision making (see chart for details).    MDM Rules/Calculators/A&P                         Radiographic imaging does not show any acute abnormality.  Physical exam suspicious for mild meniscus injury, low suspicion for major meniscus tear based on exam.  Minimal suspicion for collateral ligament injury, possible that patient had minor strain, with no evidence of laxity at this time.  Favor minor collateral ligament strain versus minor meniscus injury.  Recommend patient have further evaluation at orthopedist.  Recommend rest, ice, compression, and elevation of the affected limb.  Recommend Tylenol and ibuprofen for pain control.  Limb is neurovascularly intact at this time.  Return precautions given, patient discharged in stable condition. Final Clinical Impression(s) / ED Diagnoses Final diagnoses:  Acute pain of left knee    Rx / DC Orders ED Discharge Orders     None        [place] 11/17/20 2026    Charlynne Pander, MD 11/17/20 2255

## 2020-11-17 NOTE — Discharge Instructions (Addendum)
As we discussed there is no evidence of a break in your knee or surrounding bones, there is no evidence of a large joint effusion on your x-ray.  Based on the physical exam but I did I do not have minimal concern for a severe meniscus tear, or severe collateral ligament (ACL, etc) at this time.  It is possible that you had a minor tear of the meniscus, or a strain of one of your collateral ligaments.  I recommend that you follow-up with orthopedics for further evaluation.  Please use Tylenol or ibuprofen for pain.  You may use 600 mg ibuprofen every 6 hours or 1000 mg of Tylenol every 6 hours.  You may choose to alternate between the 2.  This would be most effective.  Not to exceed 4 g of Tylenol within 24 hours.  Not to exceed 3200 mg ibuprofen 24 hours.  Additionally you can get a knee brace, I recommend resting, ice, elevation of the affected limb.

## 2020-11-17 NOTE — ED Triage Notes (Signed)
Pt arrived via POV, c/o left knee pain x2 weeks. States he was playing basketball, came down on it wrong and pain since. Ambulatory in triage.

## 2020-11-18 ENCOUNTER — Telehealth: Payer: Self-pay

## 2020-11-18 NOTE — Telephone Encounter (Signed)
Transition Care Management Unsuccessful Follow-up Telephone Call  Date of discharge and from where:  11/17/2020-[place]  Attempts:  1st Attempt  Reason for unsuccessful TCM follow-up call:  Left voice message    

## 2020-11-19 DIAGNOSIS — M7652 Patellar tendinitis, left knee: Secondary | ICD-10-CM | POA: Diagnosis not present

## 2020-11-20 NOTE — Telephone Encounter (Signed)
Transition Care Management Follow-up Telephone Call Date of discharge and from where: 11/17/2020 from [name] How have you been since you were released from the hospital? Pt stated that he is feeling better and did not have any questions or concerns at this time.  Any questions or concerns? No  Items Reviewed: Did the pt receive and understand the discharge instructions provided? Yes  Medications obtained and verified? Yes  Other? No  Any new allergies since your discharge? No  Dietary orders reviewed? No Do you have support at home? Yes   Functional Questionnaire: (I = Independent and D = Dependent) ADLs: I  Bathing/Dressing- I  Meal Prep- I  Eating- I  Maintaining continence- I  Transferring/Ambulation- I  Managing Meds- I  Follow up appointments reviewed:  PCP Hospital f/u appt confirmed? No  Specialist Hospital f/u appt confirmed? No   Are transportation arrangements needed? No  If their condition worsens, is the pt aware to call PCP or go to the Emergency Dept.? Yes Was the patient provided with contact information for the PCP's office or ED? Yes Was to pt encouraged to call back with questions or concerns? Yes

## 2023-04-04 IMAGING — CR DG KNEE COMPLETE 4+V*L*
4 series · 4 of 4 positions shown · non-contrast
Comparison: None.

CLINICAL DATA: Left knee pain and swelling for several weeks
following basketball injury, initial encounter

EXAM:
LEFT KNEE - COMPLETE 4+ VIEW

[t knee ap left]
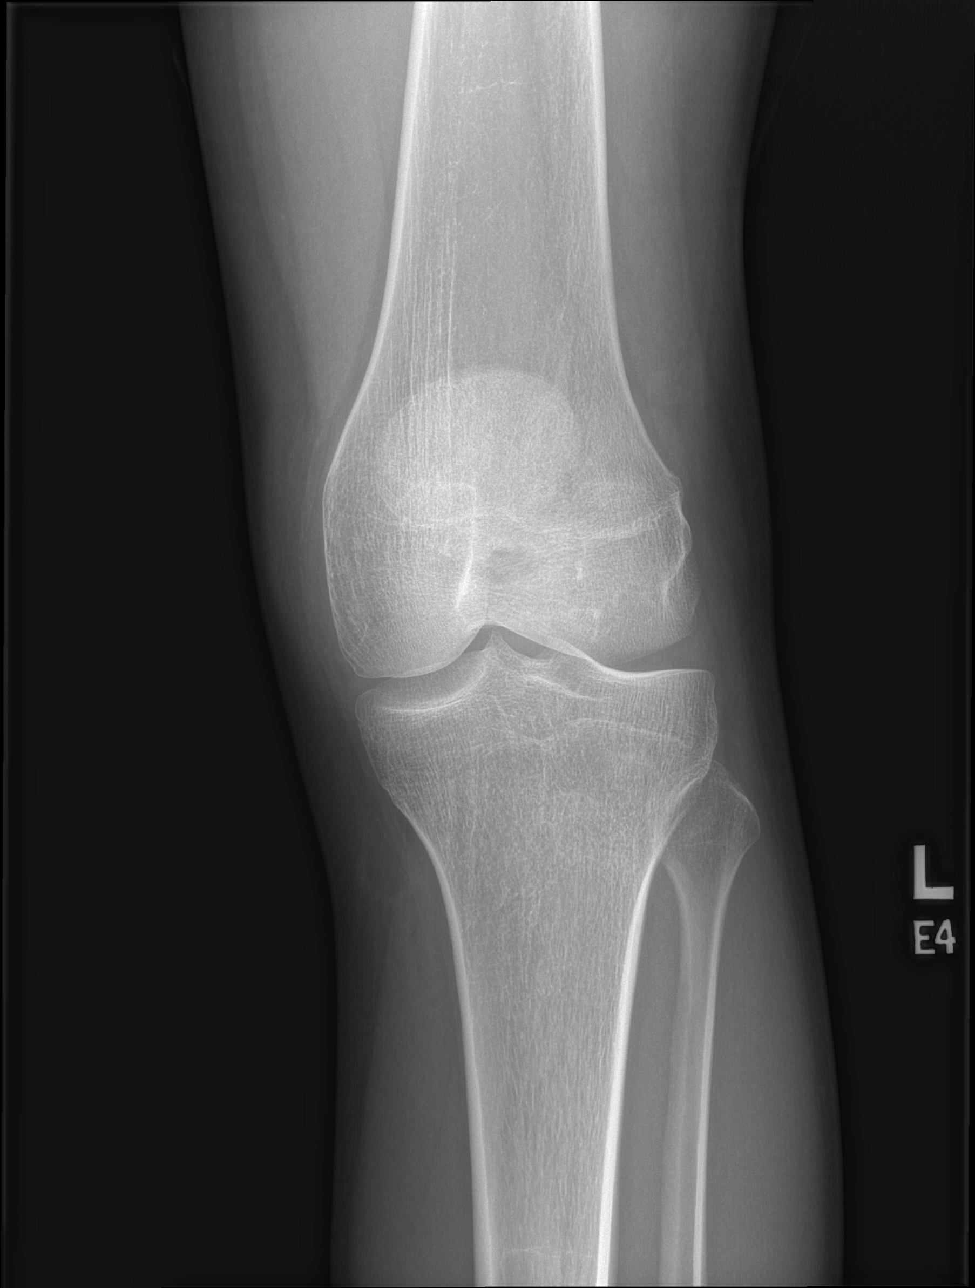

[t knee obl left (1 of 2)]
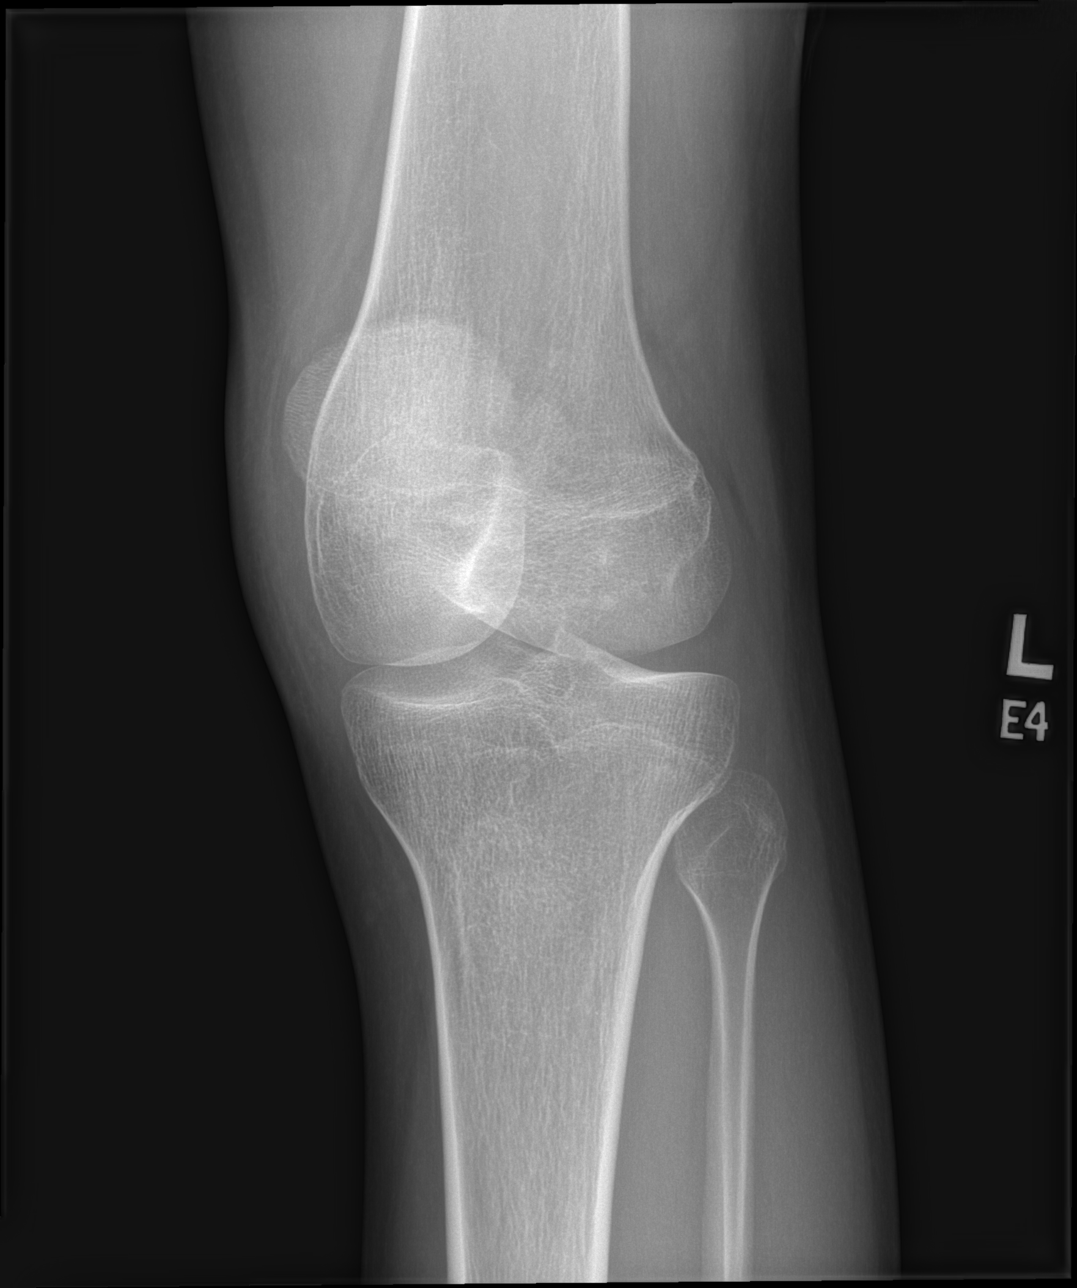

[t knee obl left (2 of 2)]
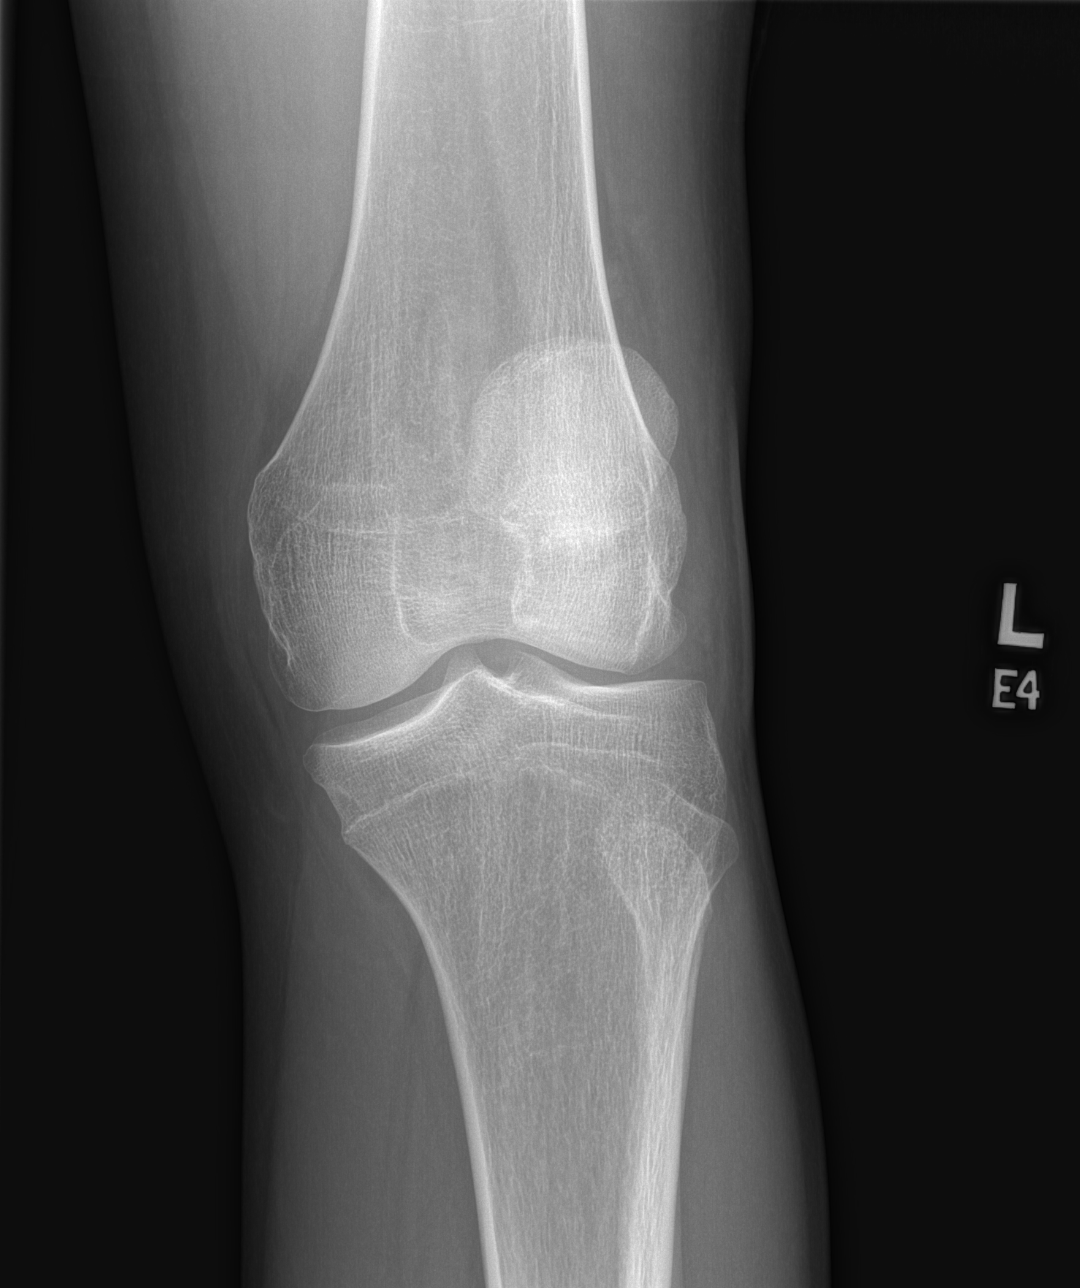

[t knee lat left]
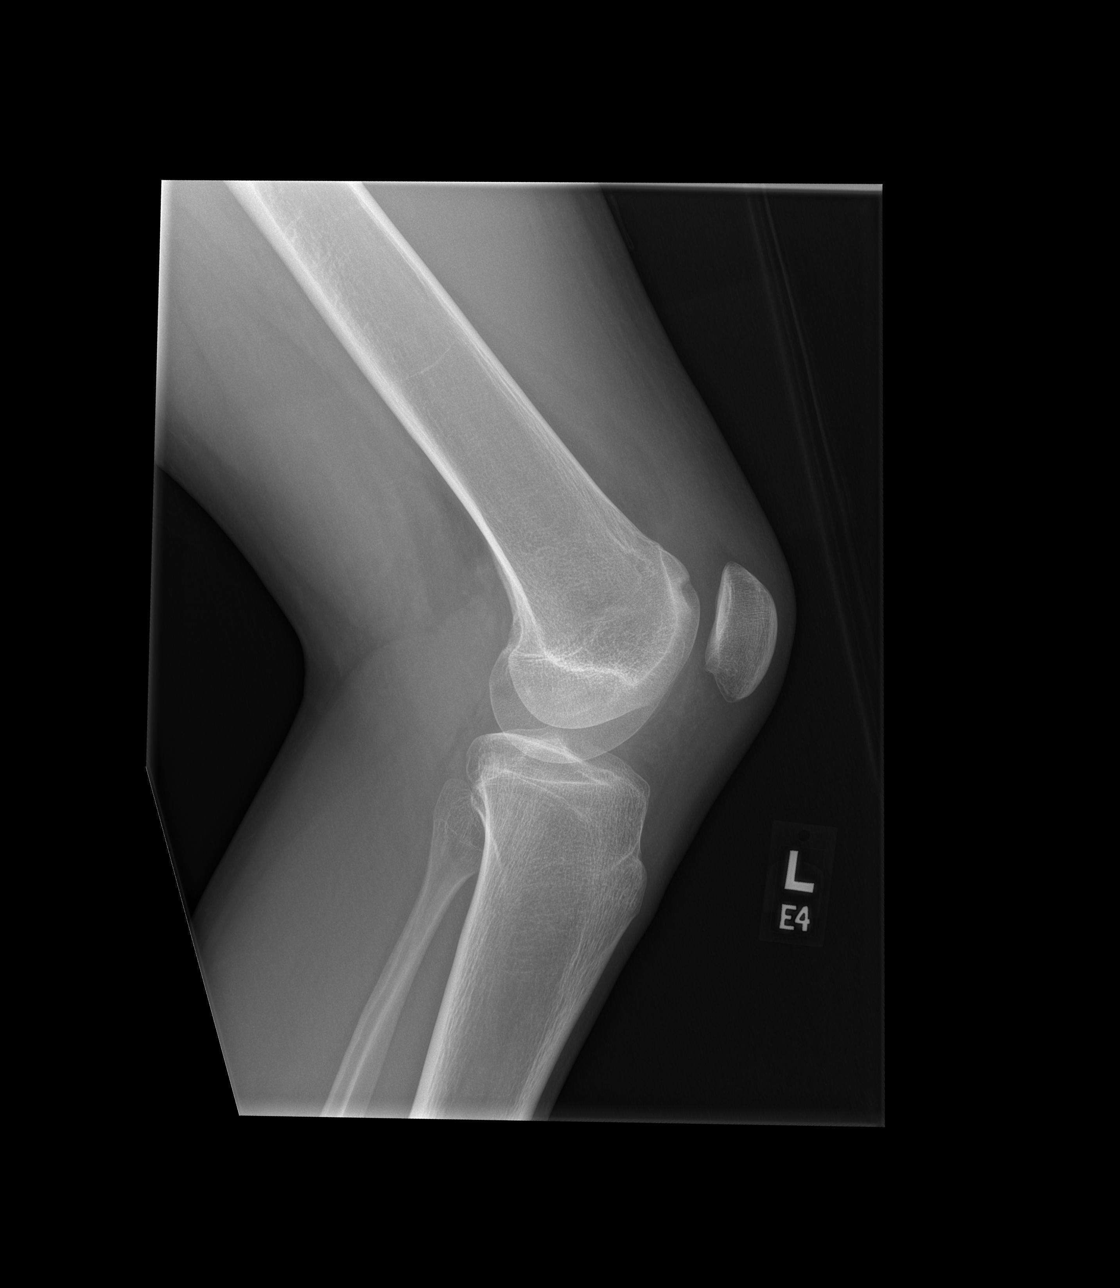

[4 of 4 positions shown; findings below may reference images not displayed]

FINDINGS: No evidence of fracture, dislocation, or joint effusion. No evidence
of arthropathy or other focal bone abnormality. Soft tissues are
unremarkable.
IMPRESSION: No acute abnormality noted.
